# Patient Record
Sex: Male | Born: 1965 | Race: White | Hispanic: No | Smoking: Current every day smoker
Health system: Southern US, Community
[De-identification: ages and names within clinical notes are randomized; demographics above are authoritative.]

## PROBLEM LIST (undated history)

## (undated) DIAGNOSIS — B1920 Unspecified viral hepatitis C without hepatic coma: Secondary | ICD-10-CM

## (undated) DIAGNOSIS — A159 Respiratory tuberculosis unspecified: Secondary | ICD-10-CM

---

## 2016-03-06 ENCOUNTER — Emergency Department (HOSPITAL_COMMUNITY)
Admission: EM | Admit: 2016-03-06 | Discharge: 2016-03-07 | Disposition: A | Discharge status: Home / Self Care | Payer: Self-pay | Attending: Emergency Medicine | Admitting: Emergency Medicine

## 2016-03-06 ENCOUNTER — Emergency Department (HOSPITAL_COMMUNITY): Payer: Self-pay

## 2016-03-06 ENCOUNTER — Encounter (HOSPITAL_COMMUNITY): Payer: Self-pay | Admitting: Emergency Medicine

## 2016-03-06 DIAGNOSIS — Y999 Unspecified external cause status: Secondary | ICD-10-CM | POA: Insufficient documentation

## 2016-03-06 DIAGNOSIS — Y939 Activity, unspecified: Secondary | ICD-10-CM | POA: Insufficient documentation

## 2016-03-06 DIAGNOSIS — W010XXA Fall on same level from slipping, tripping and stumbling without subsequent striking against object, initial encounter: Secondary | ICD-10-CM | POA: Insufficient documentation

## 2016-03-06 DIAGNOSIS — M545 Low back pain, unspecified: Secondary | ICD-10-CM

## 2016-03-06 DIAGNOSIS — M79671 Pain in right foot: Secondary | ICD-10-CM

## 2016-03-06 DIAGNOSIS — Y929 Unspecified place or not applicable: Secondary | ICD-10-CM | POA: Insufficient documentation

## 2016-03-06 DIAGNOSIS — S93601A Unspecified sprain of right foot, initial encounter: Secondary | ICD-10-CM | POA: Insufficient documentation

## 2016-03-06 DIAGNOSIS — F17210 Nicotine dependence, cigarettes, uncomplicated: Secondary | ICD-10-CM | POA: Insufficient documentation

## 2016-03-06 HISTORY — DX: Unspecified viral hepatitis C without hepatic coma: B19.20

## 2016-03-06 HISTORY — DX: Respiratory tuberculosis unspecified: A15.9

## 2016-03-06 NOTE — ED Triage Notes (Signed)
Patient complaining of right foot pain. Patient tripped over curb and landed on elbows. Patient states his right foot feels broke.

## 2016-03-06 NOTE — ED Provider Notes (Signed)
[facility]-EMERGENCY DEPT Provider Note   CSN: [id number] Arrival date & time: 03/06/16  2217   By signing my name below, I, Bobbie StackChristopher Reid, attest that this documentation has been prepared under the direction and in the presence of TX[employer] CorpHannah Tauno Falotico, PA-C. Electronically Signed: Bobbie Stackhristopher Reid, Scribe. 03/07/16. 12:30 AM. History   Chief Complaint Chief Complaint  Patient presents with  . Foot Pain     The history is provided by the patient and medical records. No language interpreter was used.   HPI Comments: Georgeanna LeaDavey Frary is a 50 y.o. male who presents to the Emergency Department complaining of right foot pain s/p fall ~10 hours PTA. Patient reports that he tripped over a piece of rebar and landed on his left elbow.  He reports going to sleep tonight but he woke up and noticed moderate-severe throbbing pain in his right foot. He notes difficulty bearing weight on the extremity due to pain. He also has some associated back pain and mild left elbow pain. He has not taken any pain medications. No alleviating factors noted. No loss of bowel or bladder, no radiation of pain.    Past Medical History:  Diagnosis Date  . Hepatitis C   . TB (tuberculosis)     There are no active problems to display for this patient.   History reviewed. No pertinent surgical history.     Home Medications    Prior to Admission medications   Medication Sig Start Date End Date Taking? Authorizing Provider  methocarbamol (ROBAXIN) 500 MG tablet Take 1 tablet (500 mg total) by mouth 2 (two) times daily. 03/07/16   Dahlia ClientHannah Feliciano Wynter, PA-C    Family History History reviewed. No pertinent family history.  Social History Social History  Substance Use Topics  . Smoking status: Current Every Day Smoker    Packs/day: 0.50    Years: 15.00    Types: Cigarettes  . Smokeless tobacco: Never Used  . Alcohol use Yes     Allergies   Ibuprofen   Review of Systems Review of Systems  HENT: Negative  for facial swelling.   Cardiovascular: Negative for chest pain.  Gastrointestinal: Negative for abdominal pain.  Musculoskeletal: Positive for arthralgias, back pain and myalgias.  Neurological: Negative for syncope, weakness and numbness.     Physical Exam Updated Vital Signs BP 149/95 (BP Location: Left Arm)   Pulse 103   Temp 98.2 F (36.8 C) (Oral)   Resp 18   Ht 6\' 1"  (1.854 m)   Wt 185 lb (83.9 kg)   SpO2 100%   BMI 24.41 kg/m   Physical Exam  Constitutional: He appears well-developed and well-nourished. No distress.  HENT:  Head: Normocephalic and atraumatic.  Mouth/Throat: Oropharynx is clear and moist. No oropharyngeal exudate.  Eyes: Conjunctivae are normal.  Neck: Normal range of motion. Neck supple.  Full ROM without pain  Cardiovascular: Normal rate, regular rhythm and intact distal pulses.   Pulmonary/Chest: Effort normal and breath sounds normal. No respiratory distress. He has no wheezes.  Abdominal: Soft. He exhibits no distension. There is no tenderness.  Musculoskeletal:  Full range of motion of the T-spine and L-spine No midline tenderness to the  T-spine or L-spine Moderate tenderness to palpation of the paraspinous muscles of the L-spine  Right foot: mild swelling without echymosis to the dorsum of the right foot, full ROM of all toes and slightly limited ROM of the right ankle, sensation intact to the RLE. Strength 4/5 with dorsi flexion and plantar flexion due to  pain.  Lymphadenopathy:    He has no cervical adenopathy.  Neurological: He is alert. He has normal reflexes.  Speech is clear and goal oriented, follows commands Normal 5/5 strength in upper and lower extremities bilaterally including dorsiflexion and plantar flexion, strong and equal grip strength Sensation normal to light and sharp touch Moves extremities without ataxia, coordination intact  No Clonus  Patient does not ambulate due to foot pain.   Skin: Skin is warm and dry. No  rash noted. He is not diaphoretic. No erythema.  Abrasion noted to left elbow  Psychiatric: He has a normal mood and affect. His behavior is normal.  Nursing note and vitals reviewed.    ED Treatments / Results  DIAGNOSTIC STUDIES: Oxygen Saturation is 100% on RA, normal by my interpretation.    COORDINATION OF CARE: 12:20 AM Discussed treatment plan with pt at bedside and pt agreed to plan.   Radiology Dg Foot Complete Right  Result Date: 03/06/2016 CLINICAL DATA:  Medial right foot pain after falling at 14:00 today. Trip and fall in a parking lot. EXAM: RIGHT FOOT COMPLETE - 3+ VIEW COMPARISON:  None. FINDINGS: Negative for acute fracture, dislocation or radiopaque foreign body. Incidentally noted osteochondroma at the base of the first metatarsal. IMPRESSION: Negative for acute fracture. Electronically Signed   By: Ellery Plunkaniel R Mitchell M.D.   On: 03/06/2016 23:28    Procedures Procedures (including critical care time)  Medications Ordered in ED Medications  acetaminophen (TYLENOL) tablet 1,000 mg (not administered)  methocarbamol (ROBAXIN) tablet 500 mg (not administered)     Initial Impression / Assessment and Plan / ED Course  I have reviewed the triage vital signs and the nursing notes.  Pertinent labs & imaging results that were available during my care of the patient were reviewed by me and considered in my medical decision making (see chart for details).  Clinical Course as of Mar 07 36  Sat Mar 07, 2016  0037 No fracture DG Foot Complete Right [HM]  0037 Patient noted to be tachycardic at triage however no tachycardia on clinical exam. Pulse Rate: 103 [HM]    Clinical Course User Index [HM] Dierdre ForthHannah Lorianne Malbrough, PA-C    Patient X-Ray negative for obvious fracture or dislocation. Pain managed in ED. Pt advised to follow up with orthopedics if symptoms persist for possibility of missed fracture diagnosis. Patient given brace while in ED, conservative therapy  recommended and discussed. Patient will be dc home & is agreeable with above plan.    Final Clinical Impressions(s) / ED Diagnoses   Final diagnoses:  Foot pain, right  Acute bilateral low back pain without sciatica  Sprain of right foot, initial encounter    New Prescriptions New Prescriptions   METHOCARBAMOL (ROBAXIN) 500 MG TABLET    Take 1 tablet (500 mg total) by mouth 2 (two) times daily.   I personally performed the services described in this documentation, which was scribed in my presence. The recorded information has been reviewed and is accurate.    Dierdre ForthHannah Chastin Riesgo, PA-C 03/07/16 0038    Nira ConnPedro Eduardo Cardama, MD 03/07/16 [id number]

## 2016-03-07 MED ORDER — METHOCARBAMOL 500 MG PO TABS: 500.0000 mg | ORAL_TABLET | Freq: Two times a day (BID) | ORAL | 0 refills | Status: AC

## 2016-03-07 MED ORDER — ACETAMINOPHEN 500 MG PO TABS
1000.0000 mg | ORAL_TABLET | Freq: Once | ORAL | Status: AC
  Administered 2016-03-07: 1000 mg via ORAL
  Filled 2016-03-07: qty 2

## 2016-03-07 MED ORDER — METHOCARBAMOL 500 MG PO TABS
500.0000 mg | ORAL_TABLET | Freq: Once | ORAL | Status: AC
  Administered 2016-03-07: 500 mg via ORAL
  Filled 2016-03-07: qty 1

## 2016-03-07 NOTE — Discharge Instructions (Signed)
1. Medications: Robaxin for back  muscle spasms, tylenol for pain control, usual home medications 2. Treatment: rest, ice, elevate and use brace, drink plenty of fluids, gentle stretching 3. Follow Up: Please followup with orthopedics as directed or your PCP in 1 week if no improvement for discussion of your diagnoses and further evaluation after today's visit; if you do not have a primary care doctor use the resource guide provided to find one; Please return to the ER for worsening symptoms or other concerns

## 2018-03-09 IMAGING — CR DG FOOT COMPLETE 3+V*R*
3 series · 3 of 3 positions shown · non-contrast
Comparison: None.

CLINICAL DATA: Medial right foot pain after falling at [DATE] today.
Trip and fall in a parking lot.

EXAM:
RIGHT FOOT COMPLETE - 3+ VIEW

[x foot ap right]
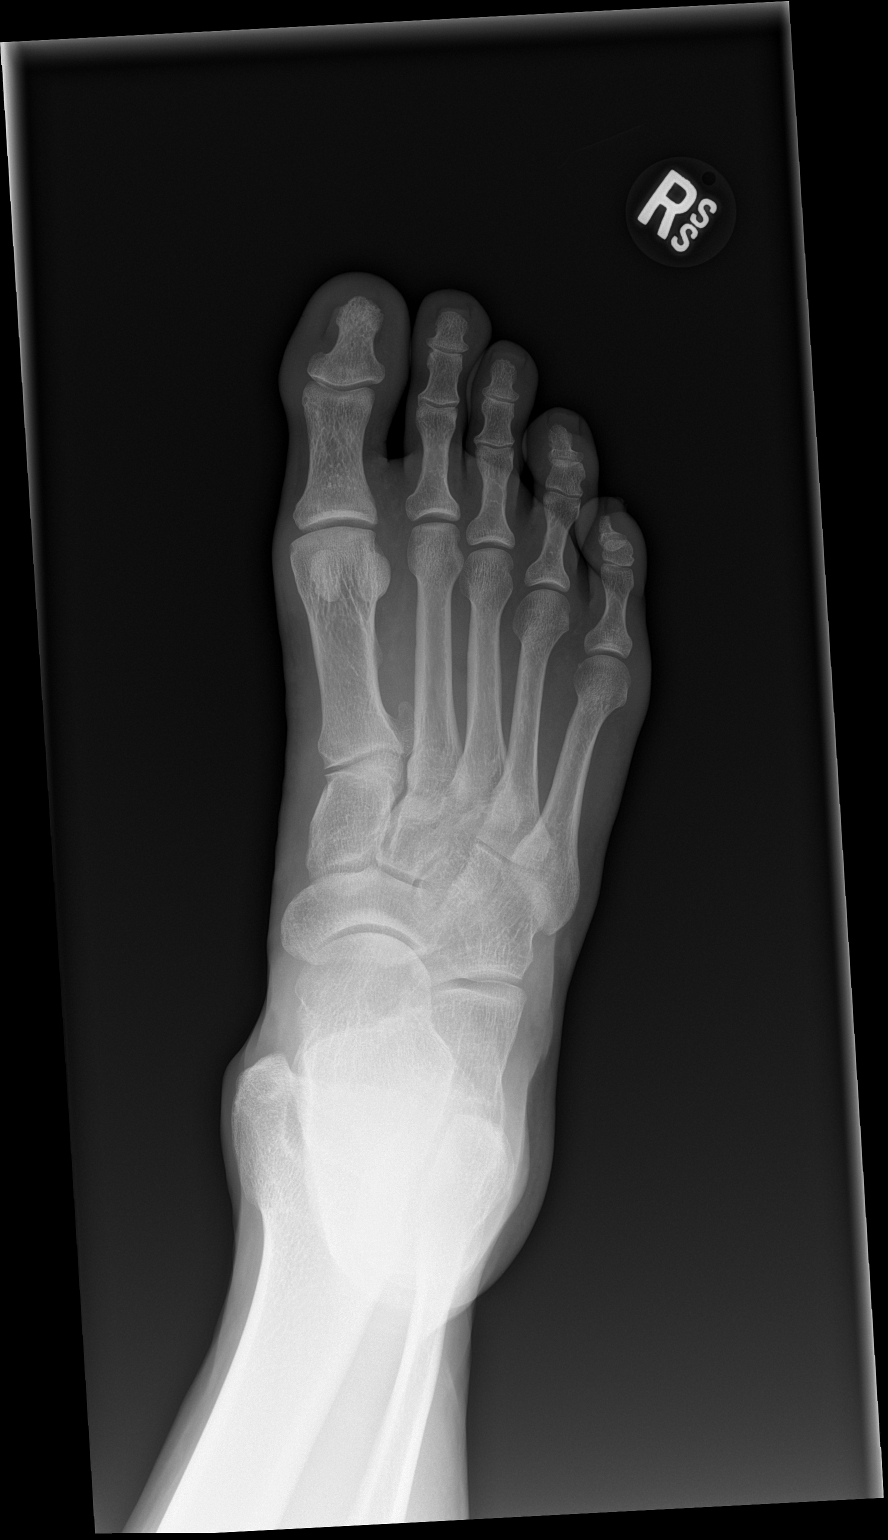

[x foot obl right]
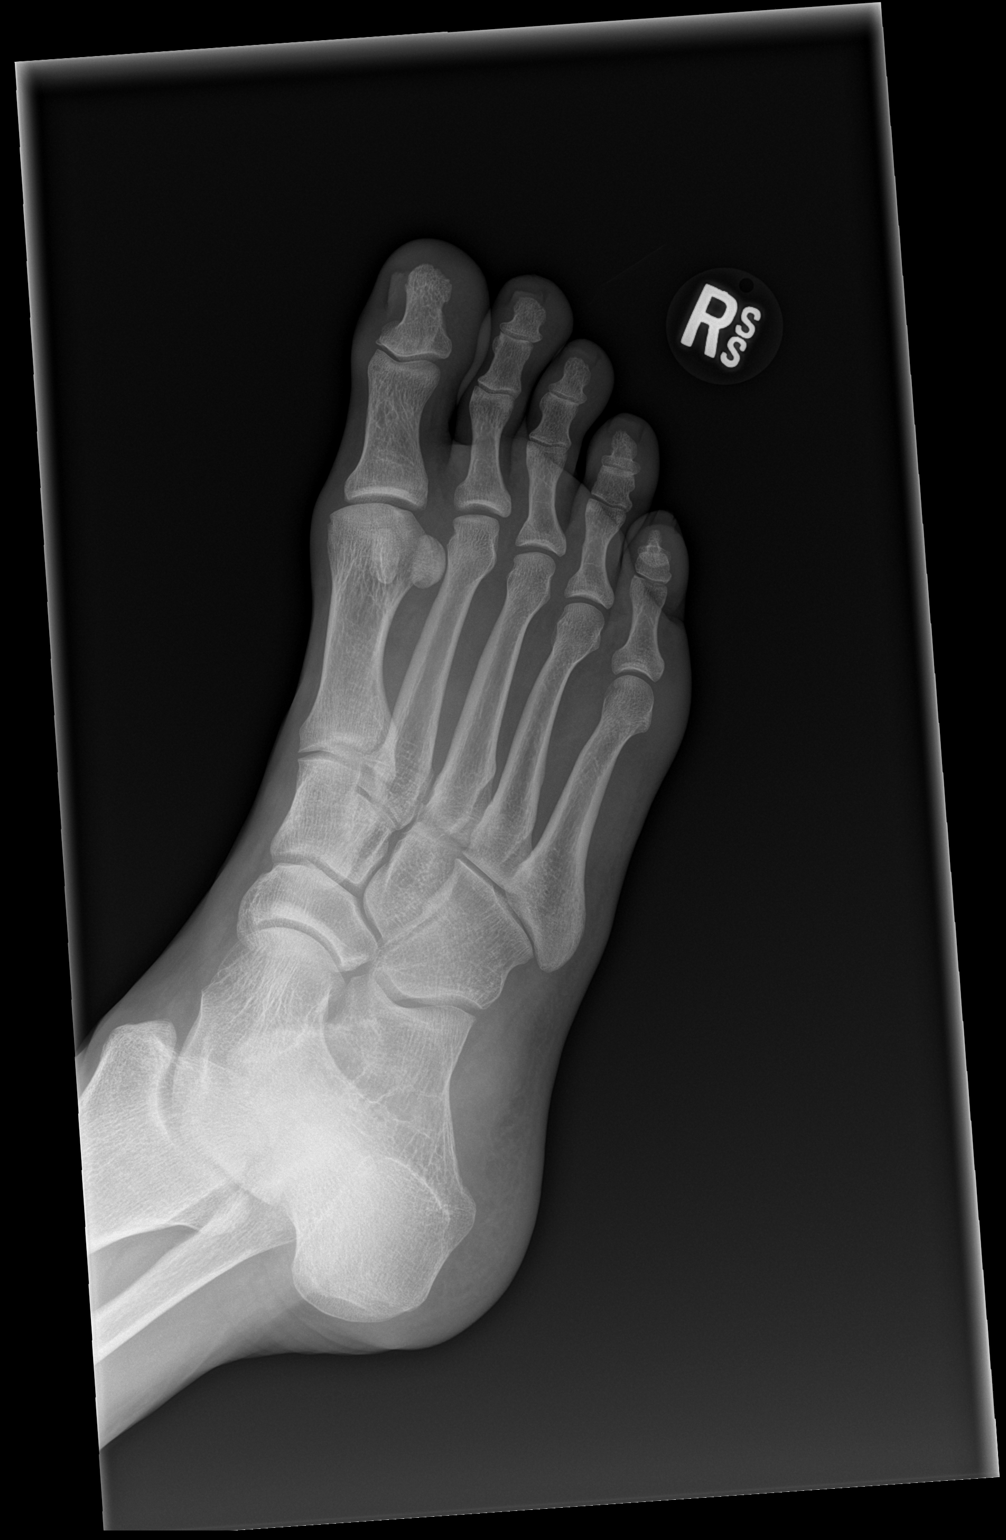

[x foot lat right]
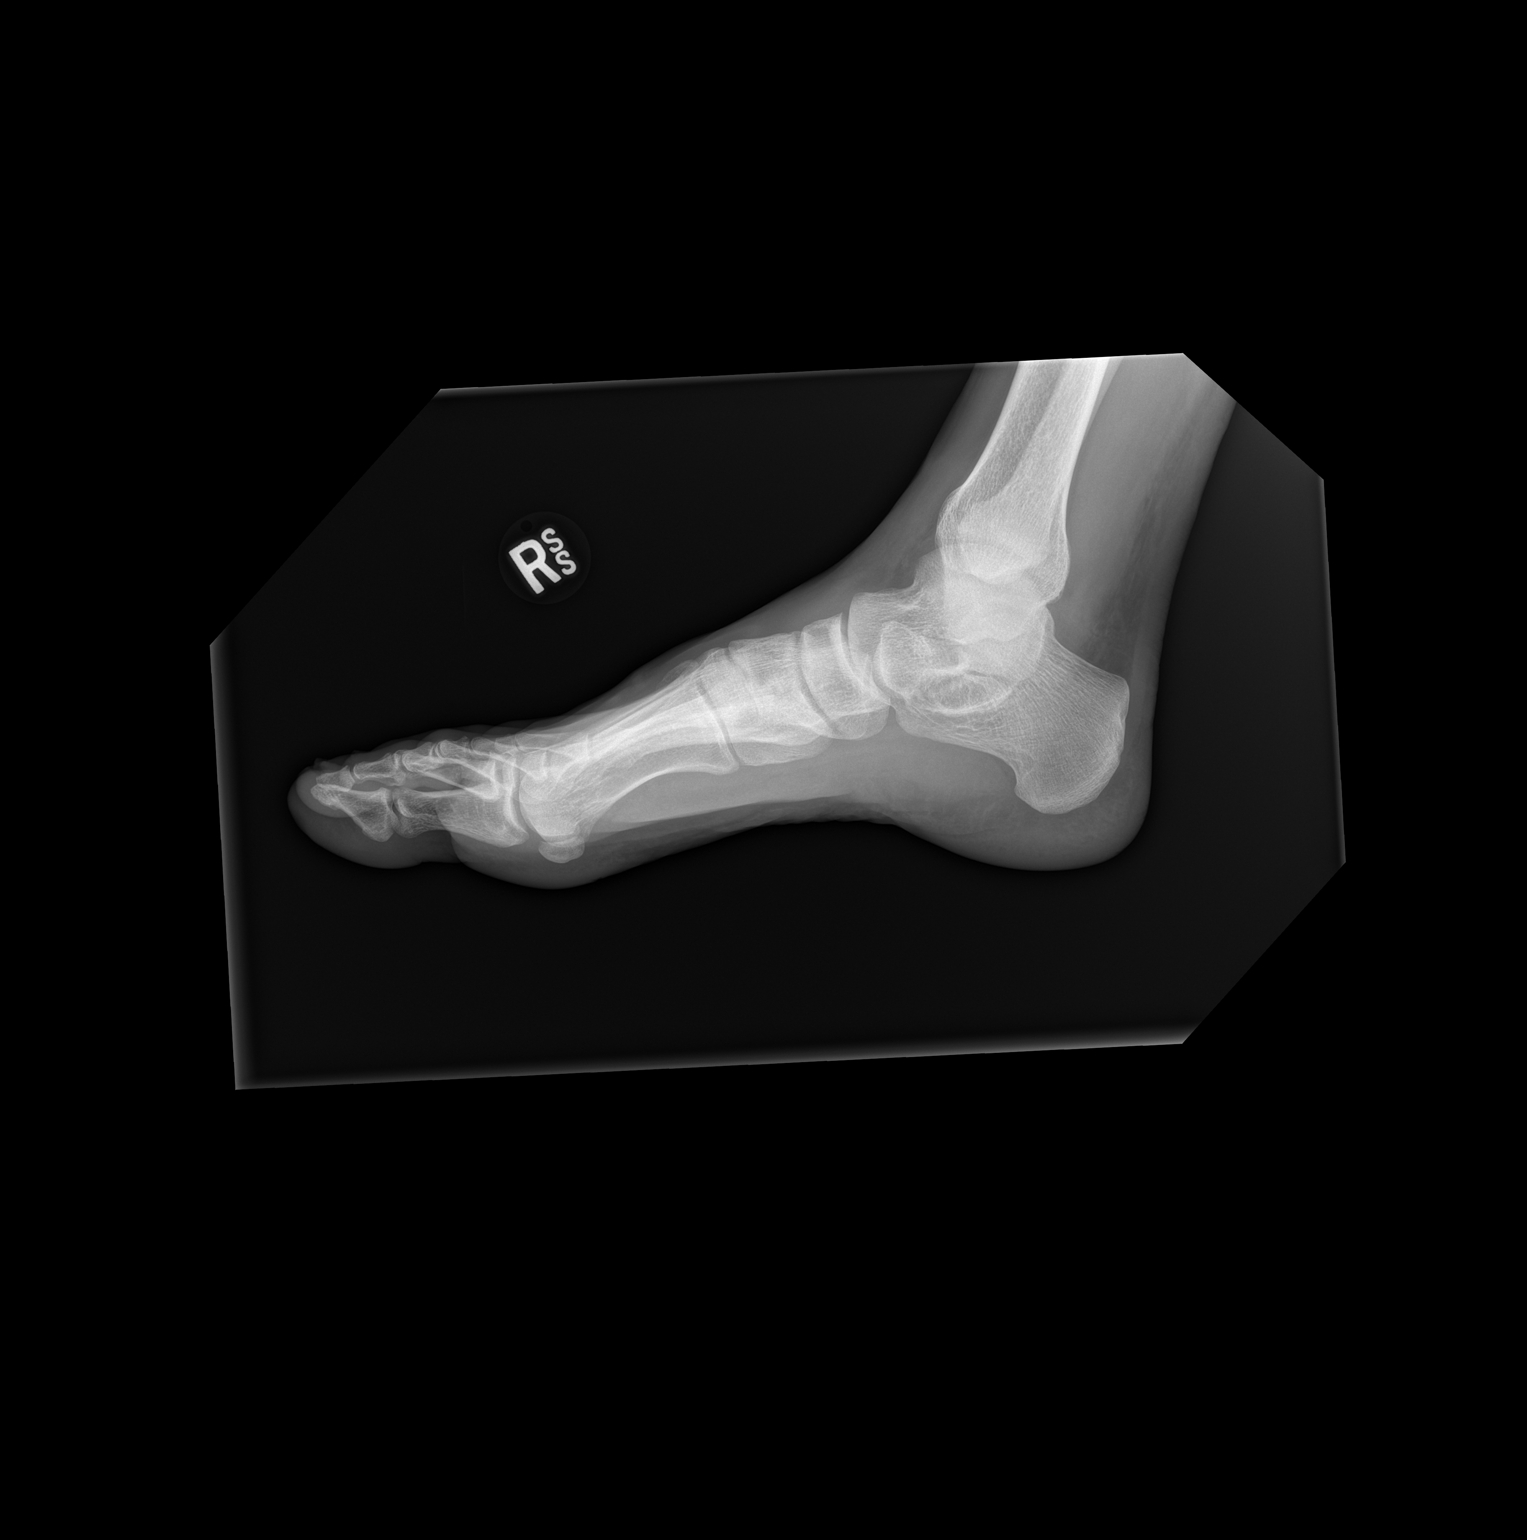

[3 of 3 positions shown; findings below may reference images not displayed]

FINDINGS: Negative for acute fracture, dislocation or radiopaque foreign body.
Incidentally noted osteochondroma at the base of the first
metatarsal.
IMPRESSION: Negative for acute fracture.
